# Patient Record
Sex: Female | Born: 1995 | Race: White | Hispanic: No | Marital: Married | Smoking: Never smoker
Health system: Southern US, Community
[De-identification: ages and names within clinical notes are randomized; demographics above are authoritative.]

---

## 2021-10-11 ENCOUNTER — Emergency Department (INDEPENDENT_AMBULATORY_CARE_PROVIDER_SITE_OTHER): Payer: 59

## 2021-10-11 ENCOUNTER — Emergency Department (INDEPENDENT_AMBULATORY_CARE_PROVIDER_SITE_OTHER)
Admission: RE | Admit: 2021-10-11 | Discharge: 2021-10-11 | Disposition: A | Discharge status: Home / Self Care | Payer: 59 | Source: Ambulatory Visit

## 2021-10-11 VITALS — BP 111/72 | HR 78 | Temp 99.5°F | Resp 18 | Ht 65.0 in | Wt 150.0 lb

## 2021-10-11 DIAGNOSIS — N83201 Unspecified ovarian cyst, right side: Secondary | ICD-10-CM

## 2021-10-11 DIAGNOSIS — R102 Pelvic and perineal pain: Secondary | ICD-10-CM

## 2021-10-11 LAB — POCT URINALYSIS DIP (MANUAL ENTRY)
Bilirubin, UA: NEGATIVE
Blood, UA: NEGATIVE
Glucose, UA: NEGATIVE mg/dL
Ketones, POC UA: NEGATIVE mg/dL
Nitrite, UA: NEGATIVE
Protein Ur, POC: NEGATIVE mg/dL
Spec Grav, UA: 1.025 (ref 1.010–1.025)
Urobilinogen, UA: 0.2 E.U./dL
pH, UA: 7 (ref 5.0–8.0)

## 2021-10-11 LAB — POCT URINE PREGNANCY: Preg Test, Ur: NEGATIVE

## 2021-10-11 MED ORDER — CIPROFLOXACIN HCL 250 MG PO TABS: 250.0000 mg | ORAL_TABLET | Freq: Two times a day (BID) | ORAL | 0 refills | Status: AC

## 2021-10-11 MED ORDER — HYDROCODONE-ACETAMINOPHEN 5-325 MG PO TABS: 1.0000 | ORAL_TABLET | Freq: Every day | ORAL | 0 refills | Status: DC | PRN

## 2021-10-11 NOTE — ED Provider Notes (Signed)
?[facility] ? ? ? ?CSN: [id number] ?Arrival date & time: 10/11/21  1015 ? ? ?  ? ?History   ?Chief Complaint ?Chief Complaint  ?Patient presents with  ? Groin Pain  ?  Have missed the last 2 cycles of my period and am now experiencing pain on the right side of my ovary. - Entered by patient  ? ? ?HPI ?Wendy Murillo is a 26 y.o. female.  ? ?HPI Pleasant 26 year old female presents with right ovarian/suprapubic pain for 3 days.  Patient reports 2 missed menstrual cycles.  Patient reports that her GYN cannot evaluate her until July. ? ?History reviewed. No pertinent past medical history. ? ?There are no problems to display for this patient. ? ? ?History reviewed. No pertinent surgical history. ? ?OB History   ?No obstetric history on file. ?  ? ? ? ?Home Medications   ? ?Prior to Admission medications   ?Medication Sig Start Date End Date Taking? Authorizing Provider  ?ciprofloxacin (CIPRO) 250 MG tablet Take 1 tablet (250 mg total) by mouth 2 (two) times daily for 5 days. 10/11/21 10/16/21 Yes Eliezer Lofts, FNP  ?HYDROcodone-acetaminophen (NORCO/VICODIN) 5-325 MG tablet Take 1 tablet by mouth daily as needed. 10/11/21  Yes Eliezer Lofts, FNP  ? ? ?Family History ?Family History  ?Problem Relation Age of Onset  ? ADD / ADHD Mother   ? ? ?Social History ?Social History  ? ?Tobacco Use  ? Smoking status: Never  ? Smokeless tobacco: Never  ?Substance Use Topics  ? Alcohol use: Yes  ?  Comment: occ  ? Drug use: Never  ? ? ? ?Allergies   ?Amoxicillin-pot clavulanate, Cefpodoxime, and Sulfamethoxazole-trimethoprim ? ? ?Review of Systems ?Review of Systems  ?Gastrointestinal:  Positive for abdominal pain.  ?     Right lower/right suprapubic/right ovary pain x2 days  ?All other systems reviewed and are negative. ? ? ?Physical Exam ?Triage Vital Signs ?ED Triage Vitals  ?Enc Vitals Group  ?   BP 10/11/21 1046 111/72  ?   Pulse Rate 10/11/21 1046 78  ?   Resp 10/11/21 1046 18  ?   Temp 10/11/21 1046 99.5 ?F (37.5 ?C)   ?   Temp Source 10/11/21 1046 Oral  ?   SpO2 10/11/21 1046 98 %  ?   Weight 10/11/21 1043 150 lb (68 kg)  ?   Height 10/11/21 1043 5\' 5"  (1.651 m)  ?   Head Circumference --   ?   Peak Flow --   ?   Pain Score 10/11/21 1043 1  ?   Pain Loc --   ?   Pain Edu? --   ?   Excl. in [place]? --   ? ?No data found. ? ?Updated Vital Signs ?BP 111/72 (BP Location: Left Arm)   Pulse 78   Temp 99.5 ?F (37.5 ?C) (Oral)   Resp 18   Ht 5\' 5"  (1.651 m)   Wt 150 lb (68 kg)   LMP 08/08/2021   SpO2 98%   BMI 24.96 kg/m?  ? ?   ? ?Physical Exam ?Vitals and nursing note reviewed.  ?Constitutional:   ?   General: She is not in acute distress. ?   Appearance: Normal appearance. She is normal weight. She is not ill-appearing.  ?HENT:  ?   Head: Normocephalic and atraumatic.  ?   Mouth/Throat:  ?   Mouth: Mucous membranes are moist.  ?   Pharynx: Oropharynx is clear.  ?Eyes:  ?  Extraocular Movements: Extraocular movements intact.  ?   Conjunctiva/sclera: Conjunctivae normal.  ?   Pupils: Pupils are equal, round, and reactive to light.  ?Cardiovascular:  ?   Rate and Rhythm: Normal rate and regular rhythm.  ?   Pulses: Normal pulses.  ?   Heart sounds: Normal heart sounds.  ?Pulmonary:  ?   Effort: Pulmonary effort is normal.  ?   Breath sounds: Normal breath sounds. No wheezing, rhonchi or rales.  ?Abdominal:  ?   Palpations: Abdomen is soft. There is no mass.  ?   Tenderness: There is no right CVA tenderness, left CVA tenderness or rebound.  ?   Hernia: No hernia is present.  ?   Comments: Normoactive bowel sounds x4 quads, severe TTP over right suprapubic area, inferior right lower quadrant, no hepatosplenomegaly  ?Musculoskeletal:  ?   Cervical back: Normal range of motion and neck supple.  ?Skin: ?   General: Skin is warm and dry.  ?Neurological:  ?   General: No focal deficit present.  ?   Mental Status: She is alert and oriented to person, place, and time. Mental status is at baseline.  ? ? ? ?UC Treatments / Results   ?Labs ?(all labs ordered are listed, but only abnormal results are displayed) ?Labs Reviewed  ?POCT URINALYSIS DIP (MANUAL ENTRY) - Abnormal; Notable for the following components:  ?    Result Value  ? Leukocytes, UA Trace (*)   ? All other components within normal limits  ?URINE CULTURE  ?POCT URINE PREGNANCY  ? ? ?EKG ? ? ?Radiology ?US PELVIC COMPLETE WITH TRANSVAGINAL ? ?Result Date: 10/11/2021 ?CLINICAL DATA:  Pelvic pain EXAM: TRANSABDOMINAL AND TRANSVAGINAL ULTRASOUND OF PELVIS TECHNIQUE: Both transabdominal and transvaginal ultrasound examinations of the pelvis were performed. Transabdominal technique was performed for global imaging of the pelvis including uterus, ovaries, adnexal regions, and pelvic cul-de-sac. It was necessary to proceed with endovaginal exam following the transabdominal exam to visualize the uterus and ovaries. COMPARISON:  None Available. FINDINGS: Uterus Measurements: 7.8 x 3.3 x 4.2 cm = volume: 56.2 mL. Anteverted. No fibroids or other mass visualized. Two small myometrial cysts are seen in the lower uterus measuring 2 mm and 3 mm. Endometrium Thickness: 8.2 mm.  No focal abnormality visualized. Right ovary Measurements: 4.8 x 2.1 x 2.9 cm = volume: 14.4 mL. Normal appearance/no adnexal mass. Thick-walled cystic lesion of the right ovary with peripheral vascularity, compatible with corpus luteum cyst. Left ovary Measurements: 3.3 x 1.7 x 1.9 cm = volume: 5.4 mL. Normal appearance/no adnexal mass. Other findings Small volume free fluid in the right adnexa, likely physiologic. IMPRESSION: Corpus luteum cyst of the right ovary with small volume free fluid of the right adnexa, findings are likely physiologic. Electronically Signed   By: Yetta Glassman M.D.   On: 10/11/2021 13:40   ? ?Procedures ?Procedures (including critical care time) ? ?Medications Ordered in UC ?Medications - No data to display ? ?Initial Impression / Assessment and Plan / UC Course  ?I have reviewed the triage  vital signs and the nursing notes. ? ?Pertinent labs & imaging results that were available during my care of the patient were reviewed by me and considered in my medical decision making (see chart for details). ? ?  ? ?MDM: 1.  Suprapubic pain, acute-UA reveals trace leuks, urine culture ordered, will treat empirically with Cipro; 2.  Right ovarian cyst-transvaginal ultrasound revealed above, hard copy provided to patient with this AVS Rx'd Norco. Advised patient  we will treat empirically for trace leuks with Cipro.  Advised patient to take medication as directed with food to completion.  Encouraged patient increase daily water intake while taking this medication.  Advised patient we will follow-up with urine culture results once received.  Advised patient may take Norco daily, as needed for breakthrough pain for right ovarian cyst.  Advised patient follow-up with her GYN for further evaluation of right ovarian cyst.  Patient discharged home, hemodynamically stable. ?Final Clinical Impressions(s) / UC Diagnoses  ? ?Final diagnoses:  ?Suprapubic pain, acute  ?Right ovarian cyst  ? ? ? ?Discharge Instructions   ? ?  ?Advised patient we will treat empirically for trace leuks with Cipro.  Advised patient to take medication as directed with food to completion.  Encouraged patient increase daily water intake while taking this medication.  Advised patient we will follow-up with urine culture results once received.  Advised patient may take Norco daily, as needed for breakthrough pain for right ovarian cyst.  Advised patient follow-up with her GYN for further evaluation of right ovarian cyst. ? ? ? ? ?ED Prescriptions   ? ? Medication Sig Dispense Auth. Provider  ? ciprofloxacin (CIPRO) 250 MG tablet Take 1 tablet (250 mg total) by mouth 2 (two) times daily for 5 days. 10 tablet Eliezer Lofts, FNP  ? HYDROcodone-acetaminophen (NORCO/VICODIN) 5-325 MG tablet Take 1 tablet by mouth daily as needed. 30 tablet Eliezer Lofts,  FNP  ? ?  ? ?I have reviewed the PDMP during this encounter. ?  ?Eliezer Lofts, [place] ?10/11/21 1420 ? ?

## 2021-10-11 NOTE — Discharge Instructions (Addendum)
Advised patient we will treat empirically for trace leuks with Cipro.  Advised patient to take medication as directed with food to completion.  Encouraged patient increase daily water intake while taking this medication.  Advised patient we will follow-up with urine culture results once received.  Advised patient may take Norco daily, as needed for breakthrough pain for right ovarian cyst.  Advised patient follow-up with her GYN for further evaluation of right ovarian cyst. ?

## 2021-10-11 NOTE — ED Triage Notes (Signed)
Right side of abdominal pain. X3days ? ?Pt states that the pain is sharp. Pt states that the pain comes and goes.  ?

## 2021-10-12 LAB — URINE CULTURE
MICRO NUMBER:: 13383150
Result:: NO GROWTH
SPECIMEN QUALITY:: ADEQUATE

## 2022-01-07 ENCOUNTER — Ambulatory Visit (INDEPENDENT_AMBULATORY_CARE_PROVIDER_SITE_OTHER): Payer: 59

## 2022-01-07 ENCOUNTER — Ambulatory Visit
Admission: RE | Admit: 2022-01-07 | Discharge: 2022-01-07 | Disposition: A | Discharge status: Home / Self Care | Payer: 59 | Source: Ambulatory Visit | Attending: Family Medicine | Admitting: Family Medicine

## 2022-01-07 VITALS — BP 123/87 | HR 105 | Temp 99.0°F | Resp 14

## 2022-01-07 DIAGNOSIS — R058 Other specified cough: Secondary | ICD-10-CM | POA: Diagnosis not present

## 2022-01-07 DIAGNOSIS — R093 Abnormal sputum: Secondary | ICD-10-CM | POA: Diagnosis not present

## 2022-01-07 DIAGNOSIS — J309 Allergic rhinitis, unspecified: Secondary | ICD-10-CM | POA: Diagnosis not present

## 2022-01-07 DIAGNOSIS — R059 Cough, unspecified: Secondary | ICD-10-CM

## 2022-01-07 DIAGNOSIS — J0100 Acute maxillary sinusitis, unspecified: Secondary | ICD-10-CM | POA: Diagnosis not present

## 2022-01-07 MED ORDER — DOXYCYCLINE HYCLATE 100 MG PO CAPS: 100.0000 mg | ORAL_CAPSULE | Freq: Two times a day (BID) | ORAL | 0 refills | Status: AC

## 2022-01-07 MED ORDER — BENZONATATE 200 MG PO CAPS: 200.0000 mg | ORAL_CAPSULE | Freq: Three times a day (TID) | ORAL | 0 refills | Status: AC | PRN

## 2022-01-07 MED ORDER — PREDNISONE 20 MG PO TABS: ORAL_TABLET | ORAL | 0 refills | Status: DC

## 2022-01-07 MED ORDER — FEXOFENADINE HCL 180 MG PO TABS: 180.0000 mg | ORAL_TABLET | Freq: Every day | ORAL | 0 refills | Status: DC

## 2022-01-07 NOTE — Discharge Instructions (Addendum)
Advised/informed patient of chest x-ray results with hard copy provided to patient. Instructed patient to take medication as directed with food to completion.  Advised patient to take Prednisone and Allegra with first dose of Doxycycline for the next 5 of 10 days.  Advised may use Allegra as needed afterwards for concurrent postnasal drip/drainage.  Advised may use Tessalon Perles daily or as needed for cough.  Encouraged patient to increase daily water intake while taking these medications.  Advised patient if symptoms worsen and/or unresolved please follow-up with PCP or here for further evaluation.

## 2022-01-07 NOTE — ED Provider Notes (Signed)
[name]    CSN: [id number] Arrival date & time: 01/07/22  1634      History   Chief Complaint Chief Complaint  Patient presents with   Cough    I have had a wet cough for going on 4 weeks now as well as congestion in my nose and ears. I am not getting better and have been taking OTC medication for 3 weeks, and it is not working. - Entered by patient    HPI Wendy Murillo is a 26 y.o. female.   HPI 26 year old female presents with cough for 4 weeks.   History reviewed. No pertinent past medical history.  There are no problems to display for this patient.   History reviewed. No pertinent surgical history.  OB History   No obstetric history on file.      Home Medications    Prior to Admission medications   Medication Sig Start Date End Date Taking? Authorizing Provider  benzonatate (TESSALON) 200 MG capsule Take 1 capsule (200 mg total) by mouth 3 (three) times daily as needed for up to 7 days. 01/07/22 01/14/22 Yes Eliezer Lofts, FNP  doxycycline (VIBRAMYCIN) 100 MG capsule Take 1 capsule (100 mg total) by mouth 2 (two) times daily for 10 days. 01/07/22 01/17/22 Yes Eliezer Lofts, FNP  fexofenadine [facility] ALLERGY) 180 MG tablet Take 1 tablet (180 mg total) by mouth daily for 15 days. 01/07/22 01/22/22 Yes Eliezer Lofts, FNP  predniSONE (DELTASONE) 20 MG tablet Take 3 tabs PO daily x 5 days. 01/07/22  Yes Eliezer Lofts, FNP    Family History Family History  Problem Relation Age of Onset   ADD / ADHD Mother     Social History Social History   Tobacco Use   Smoking status: Never   Smokeless tobacco: Never  Substance Use Topics   Alcohol use: Yes    Comment: occ   Drug use: Never     Allergies   Amoxicillin-pot clavulanate, Cefpodoxime, and Sulfamethoxazole-trimethoprim   Review of Systems Review of Systems  Respiratory:  Positive for cough.   All other systems reviewed and are negative.    Physical Exam Triage Vital Signs ED Triage  Vitals  Enc Vitals Group     BP 01/07/22 1640 123/87     Pulse Rate 01/07/22 1640 (!) 105     Resp 01/07/22 1640 14     Temp 01/07/22 1640 99 F (37.2 C)     Temp Source 01/07/22 1640 Oral     SpO2 01/07/22 1640 96 %     Weight --      Height --      Head Circumference --      Peak Flow --      Pain Score 01/07/22 1641 4     Pain Loc --      Pain Edu? --      Excl. in [place]? --    No data found.  Updated Vital Signs BP 123/87 (BP Location: Right Arm)   Pulse (!) 105   Temp 99 F (37.2 C) (Oral)   Resp 14   SpO2 96%    Physical Exam Vitals and nursing note reviewed.  Constitutional:      General: She is not in acute distress.    Appearance: Normal appearance. She is normal weight. She is not ill-appearing.  HENT:     Head: Normocephalic and atraumatic.     Right Ear: External ear normal.     Left Ear: External ear normal.  Ears:     Comments: Moderate eustachian tube dysfunction noted bilaterally; TM's are clear retracted bilaterally    Nose:     Comments: Turbinates are erythematous/edematous    Mouth/Throat:     Mouth: Mucous membranes are moist.     Pharynx: Oropharynx is clear.     Comments: Moderate amount of clear drainage of posterior oropharynx noted Eyes:     Extraocular Movements: Extraocular movements intact.     Conjunctiva/sclera: Conjunctivae normal.     Pupils: Pupils are equal, round, and reactive to light.  Cardiovascular:     Rate and Rhythm: Normal rate and regular rhythm.     Pulses: Normal pulses.     Heart sounds: Normal heart sounds. No murmur heard. Pulmonary:     Breath sounds: Wheezing present. No rhonchi or rales.     Comments: Subtle inspiratory wheezes noted throughout, frequent nonproductive/productive cough noted Musculoskeletal:     Cervical back: Normal range of motion and neck supple.  Skin:    General: Skin is warm and dry.  Neurological:     General: No focal deficit present.     Mental Status: She is alert and oriented  to person, place, and time.      UC Treatments / Results  Labs (all labs ordered are listed, but only abnormal results are displayed) Labs Reviewed - No data to display  EKG   Radiology DG Chest 2 View  Result Date: 01/07/2022 CLINICAL DATA:  Productive cough with green sputum for 4 weeks. EXAM: CHEST - 2 VIEW COMPARISON:  None Available. FINDINGS: The cardiomediastinal contours are within normal limits. The lungs are clear. No pneumothorax or pleural effusion. No acute finding in the visualized skeleton. IMPRESSION: No evidence of active disease. Electronically Signed   By: Emmaline Kluver M.D.   On: 01/07/2022 17:20    Procedures Procedures (including critical care time)  Medications Ordered in UC Medications - No data to display  Initial Impression / Assessment and Plan / UC Course  I have reviewed the triage vital signs and the nursing notes.  Pertinent labs & imaging results that were available during my care of the patient were reviewed by me and considered in my medical decision making (see chart for details).     MDM: 1.  Cough-CXR revealed above Rx'd Prednisone, Tessalon Perles; 2.  Subacute maxillary sinusitis-Rx'd Doxycycline; 3.  Allergic rhinitis-Rx'd Allegra. Patient to take medication as directed with food to completion.  Advised patient to take Prednisone and Allegra with first dose of Doxycycline for the next 5 of 10 days.  Advised may use Allegra as needed afterwards for concurrent postnasal drip/drainage.  Advised may use Tessalon Perles daily or as needed for cough.  Encouraged patient to increase daily water intake while taking these medications. Advised patient if symptoms worsen and/or unresolved please follow-up with PCP or here for further evaluation.  Work note provided to patient prior to discharge today.  Patient discharged home, hemodynamically stable. Final Clinical Impressions(s) / UC Diagnoses   Final diagnoses:  Cough, unspecified type  Subacute  maxillary sinusitis  Allergic rhinitis, unspecified seasonality, unspecified trigger     Discharge Instructions      Advised/informed patient of chest x-ray results with hard copy provided to patient. Instructed patient to take medication as directed with food to completion.  Advised patient to take Prednisone and Allegra with first dose of Doxycycline for the next 5 of 10 days.  Advised may use Allegra as needed afterwards for concurrent postnasal drip/drainage.  Advised may use Tessalon Perles daily or as needed for cough.  Encouraged patient to increase daily water intake while taking these medications.  Advised patient if symptoms worsen and/or unresolved please follow-up with PCP or here for further evaluation.     ED Prescriptions     Medication Sig Dispense Auth. Provider   doxycycline (VIBRAMYCIN) 100 MG capsule Take 1 capsule (100 mg total) by mouth 2 (two) times daily for 10 days. 20 capsule Trevor Iha, FNP   predniSONE (DELTASONE) 20 MG tablet Take 3 tabs PO daily x 5 days. 15 tablet Trevor Iha, FNP   fexofenadine [facility] ALLERGY) 180 MG tablet Take 1 tablet (180 mg total) by mouth daily for 15 days. 15 tablet Trevor Iha, FNP   benzonatate (TESSALON) 200 MG capsule Take 1 capsule (200 mg total) by mouth 3 (three) times daily as needed for up to 7 days. 40 capsule Trevor Iha, FNP      PDMP not reviewed this encounter.   Trevor Iha, FNP 01/07/22 1746

## 2022-01-07 NOTE — ED Triage Notes (Signed)
Pt presents with cough x 4 weeks.

## 2023-03-24 ENCOUNTER — Ambulatory Visit
Admission: EM | Admit: 2023-03-24 | Discharge: 2023-03-24 | Disposition: A | Discharge status: Home / Self Care | Payer: Commercial Managed Care - PPO | Attending: Family Medicine | Admitting: Family Medicine

## 2023-03-24 DIAGNOSIS — J0100 Acute maxillary sinusitis, unspecified: Secondary | ICD-10-CM | POA: Diagnosis not present

## 2023-03-24 MED ORDER — FLUTICASONE PROPIONATE 50 MCG/ACT NA SUSP: 2.0000 | Freq: Every day | NASAL | 0 refills | Status: AC

## 2023-03-24 MED ORDER — DOXYCYCLINE HYCLATE 100 MG PO CAPS: 100.0000 mg | ORAL_CAPSULE | Freq: Two times a day (BID) | ORAL | 0 refills | Status: AC

## 2023-03-24 NOTE — Discharge Instructions (Signed)
Drink lots of water Take doxycycline 2 times a day.  It is important to take this antibiotic with food Use the Flonase daily until your symptoms have resolved  If you have any sinus and ear congestion on the day that you are scheduled to fly, use Afrin (Oxymetolazone) prior to flight

## 2023-03-24 NOTE — ED Triage Notes (Signed)
Pt c/o nasal congestion, facial/ear pain, bilateral ear pressure x 2 weeks. Denies fever. Taking sudafed, tylenol and ibuprofen prn. Hx of sinus infections.

## 2023-03-24 NOTE — ED Provider Notes (Signed)
[facility]    CSN: [id number] Arrival date & time: 03/24/23  1823      History   Chief Complaint Chief Complaint  Patient presents with   Nasal Congestion    HPI Wendy Murillo is a 27 y.o. female.   Patient states that she has had sinus congestion and cold symptoms for over 2 weeks.  She has ear pressure and pain and some popping.  She is scheduled to fly out of the country in a few days and is concerned about her ear congestion.  She states that she is having thick sinus mucus.  Postnasal drip.  Cough.  Not improving with over-the-counter medications Chart is reviewed and she does have multiple antibiotic allergies including Augmentin, cephalosporins, sulfa    History reviewed. No pertinent past medical history.  There are no problems to display for this patient.   History reviewed. No pertinent surgical history.  OB History   No obstetric history on file.      Home Medications    Prior to Admission medications   Medication Sig Start Date End Date Taking? Authorizing Provider  cabergoline (DOSTINEX) 0.5 MG tablet  07/09/22  Yes [provider]  doxycycline (VIBRAMYCIN) 100 MG capsule Take 1 capsule (100 mg total) by mouth 2 (two) times daily. 03/24/23  Yes Eustace Moore, MD  fluticasone [facility]) 50 MCG/ACT nasal spray Place 2 sprays into both nostrils daily. 03/24/23  Yes Eustace Moore, MD    Family History Family History  Problem Relation Age of Onset   ADD / ADHD Mother     Social History Social History   Tobacco Use   Smoking status: Never   Smokeless tobacco: Never  Substance Use Topics   Alcohol use: Yes    Comment: occ   Drug use: Never     Allergies   Amoxicillin-pot clavulanate, Cefpodoxime, and Sulfamethoxazole-trimethoprim   Review of Systems Review of Systems See HPI  Physical Exam Triage Vital Signs ED Triage Vitals  Encounter Vitals Group     BP 03/24/23 1833 120/77     Systolic BP Percentile  --      Diastolic BP Percentile --      Pulse Rate 03/24/23 1833 (!) 103     Resp 03/24/23 1833 17     Temp 03/24/23 1833 98.4 F (36.9 C)     Temp Source 03/24/23 1833 Oral     SpO2 03/24/23 1833 97 %     Weight --      Height --      Head Circumference --      Peak Flow --      Pain Score 03/24/23 1835 4     Pain Loc --      Pain Education --      Exclude from Growth Chart --    No data found.  Updated Vital Signs BP 120/77 (BP Location: Right Arm)   Pulse (!) 103   Temp 98.4 F (36.9 C) (Oral)   Resp 17   SpO2 97%     Physical Exam Constitutional:      General: She is not in acute distress.    Appearance: She is well-developed and normal weight.  HENT:     Head: Normocephalic and atraumatic.     Right Ear: Ear canal normal. There is no impacted cerumen.     Left Ear: Ear canal normal. There is no impacted cerumen.     Ears:     Comments: Injection of  TMs.  Dull.    Nose: Congestion and rhinorrhea present.     Mouth/Throat:     Pharynx: Posterior oropharyngeal erythema present.  Eyes:     Conjunctiva/sclera: Conjunctivae normal.     Pupils: Pupils are equal, round, and reactive to light.  Cardiovascular:     Rate and Rhythm: Normal rate and regular rhythm.     Heart sounds: Normal heart sounds.  Pulmonary:     Effort: Pulmonary effort is normal. No respiratory distress.     Breath sounds: Normal breath sounds.  Abdominal:     General: There is no distension.     Palpations: Abdomen is soft.  Musculoskeletal:        General: Normal range of motion.     Cervical back: Normal range of motion.  Lymphadenopathy:     Cervical: Cervical adenopathy present.  Skin:    General: Skin is warm and dry.  Neurological:     Mental Status: She is alert.      UC Treatments / Results  Labs (all labs ordered are listed, but only abnormal results are displayed) Labs Reviewed - No data to display  EKG   Radiology No results found.  Procedures Procedures  (including critical care time)  Medications Ordered in UC Medications - No data to display  Initial Impression / Assessment and Plan / UC Course  I have reviewed the triage vital signs and the nursing notes.  Pertinent labs & imaging results that were available during my care of the patient were reviewed by me and considered in my medical decision making (see chart for details).     Final Clinical Impressions(s) / UC Diagnoses   Final diagnoses:  Acute non-recurrent maxillary sinusitis     Discharge Instructions      Drink lots of water Take doxycycline 2 times a day.  It is important to take this antibiotic with food Use the Flonase daily until your symptoms have resolved  If you have any sinus and ear congestion on the day that you are scheduled to fly, use Afrin (Oxymetolazone) prior to flight   ED Prescriptions     Medication Sig Dispense Auth. Provider   doxycycline (VIBRAMYCIN) 100 MG capsule Take 1 capsule (100 mg total) by mouth 2 (two) times daily. 14 capsule Eustace Moore, MD   fluticasone [facility]) 50 MCG/ACT nasal spray Place 2 sprays into both nostrils daily. 16 g Eustace Moore, MD      PDMP not reviewed this encounter.   Eustace Moore, MD 03/24/23 [phone]

## 2024-02-27 IMAGING — US US PELVIS COMPLETE WITH TRANSVAGINAL
1 series · 13 of 25 positions shown · non-contrast
Comparison: None Available.

CLINICAL DATA: Pelvic pain

EXAM:
TRANSABDOMINAL AND TRANSVAGINAL ULTRASOUND OF PELVIS
TECHNIQUE: Both transabdominal and transvaginal ultrasound examinations of the
pelvis were performed. Transabdominal technique was performed for
global imaging of the pelvis including uterus, ovaries, adnexal
regions, and pelvic cul-de-sac. It was necessary to proceed with
endovaginal exam following the transabdominal exam to visualize the
uterus and ovaries.

[Series 1: us pelvic complete with transvaginal · 13 of 118 slices shown]
[im 1/118]
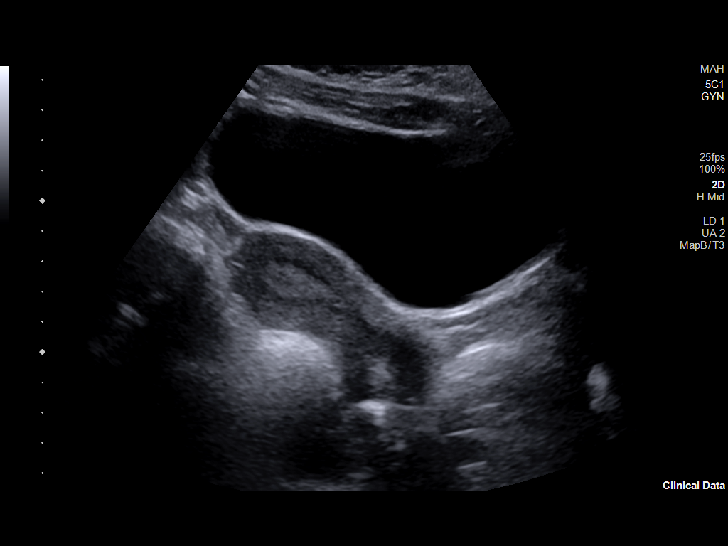
[im 10/118]
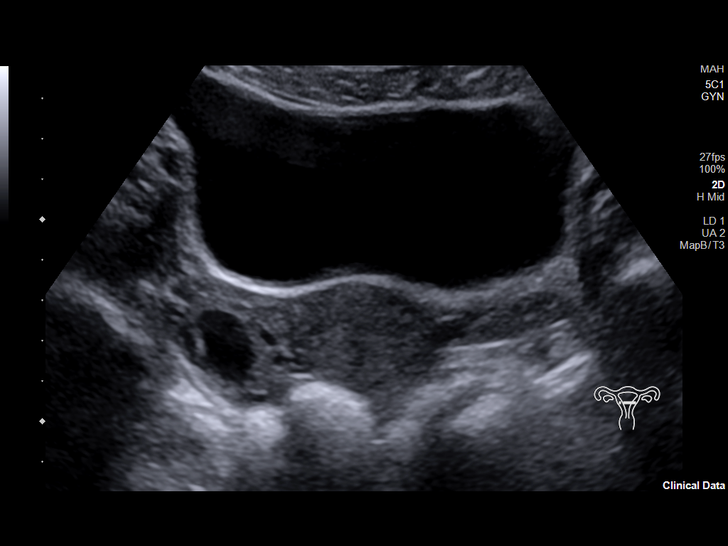
[im 20/118]
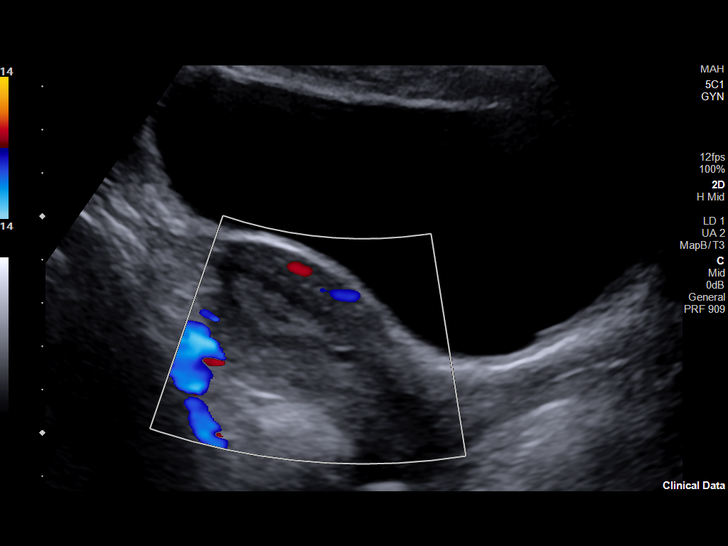
[im 30/118]
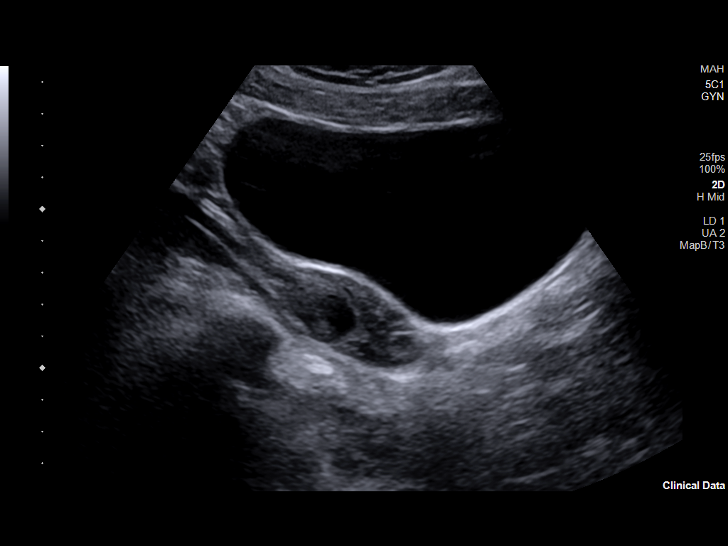
[im 40/118]
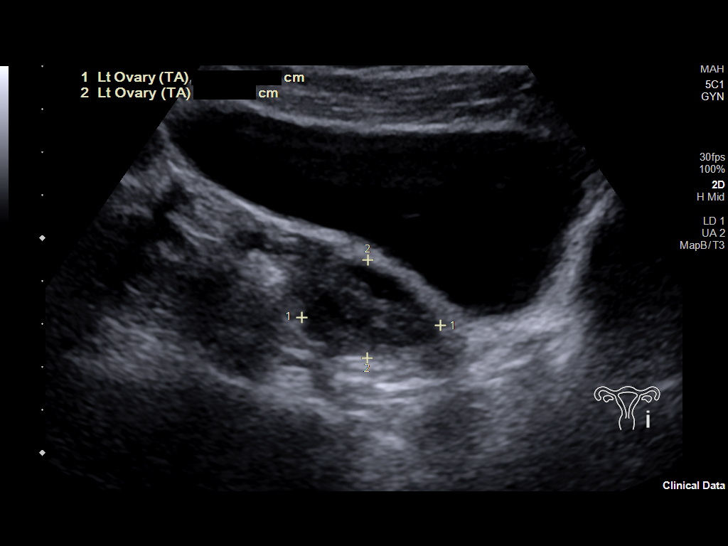
[im 49/118]
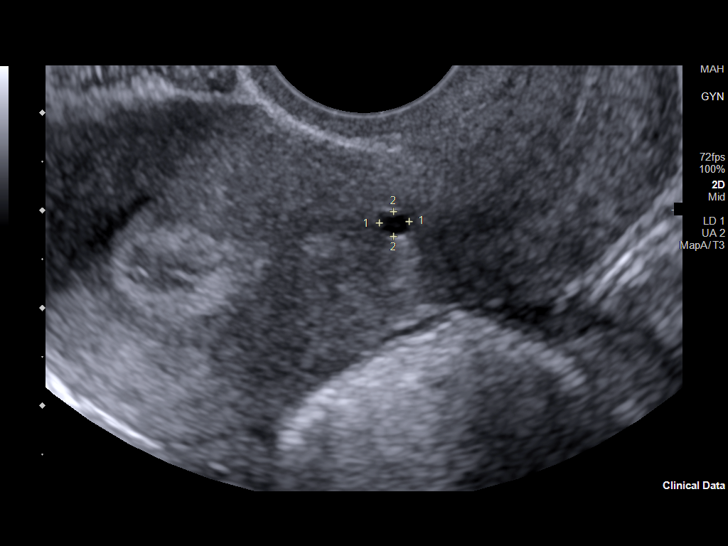
[im 59/118]
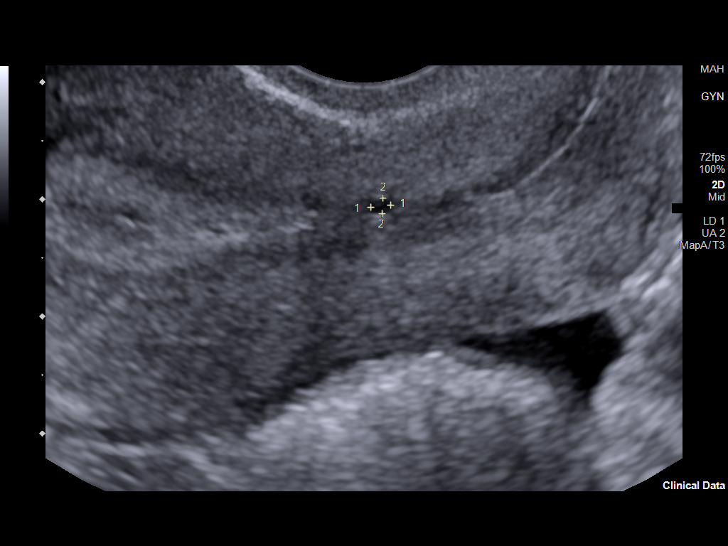
[im 69/118]
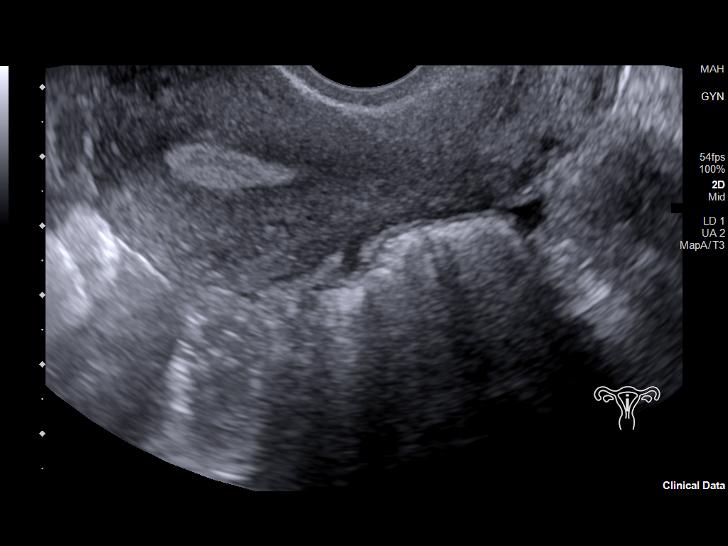
[im 79/118]
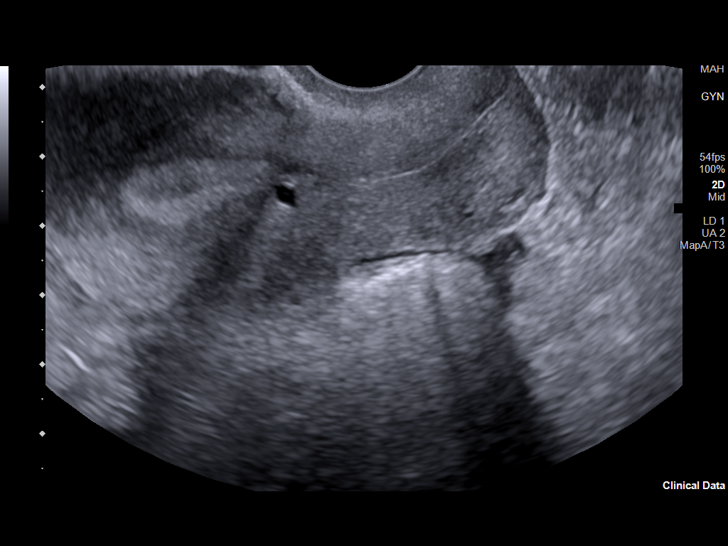
[im 88/118]
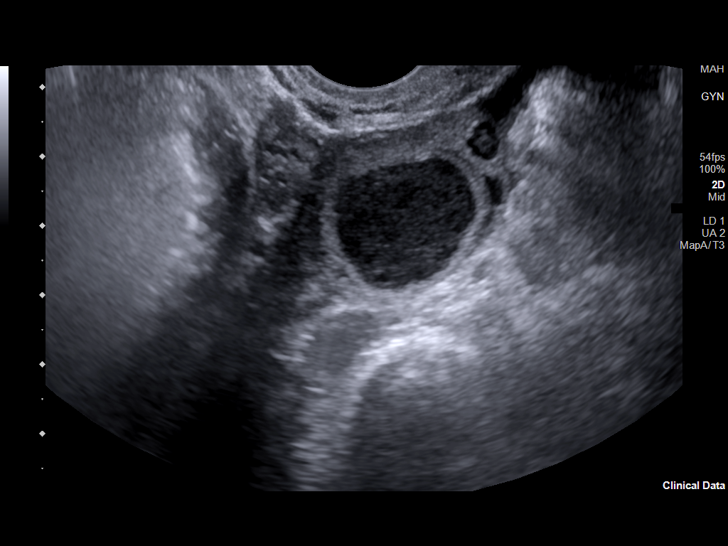
[im 98/118]
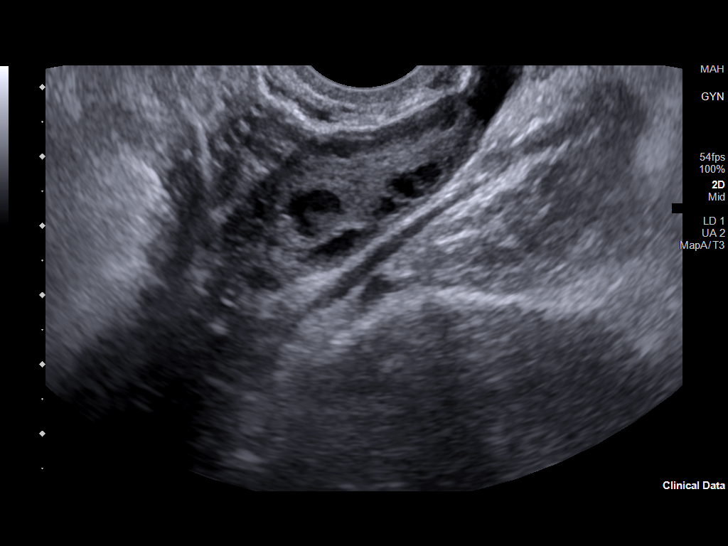
[im 108/118]
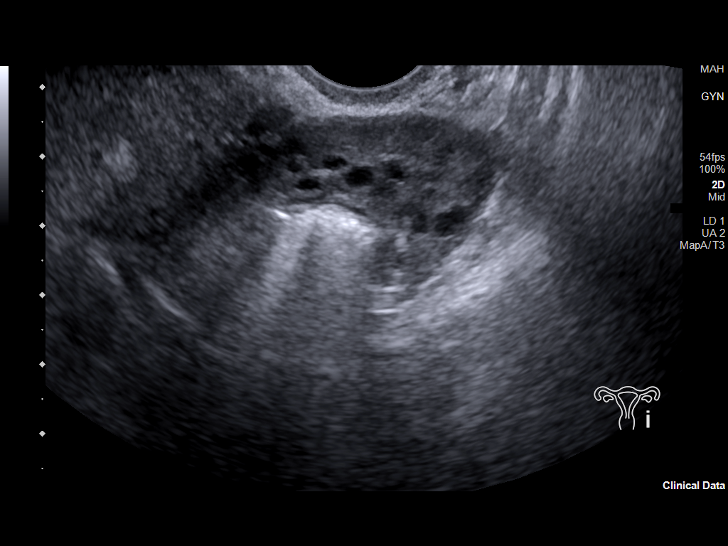
[im 118/118]
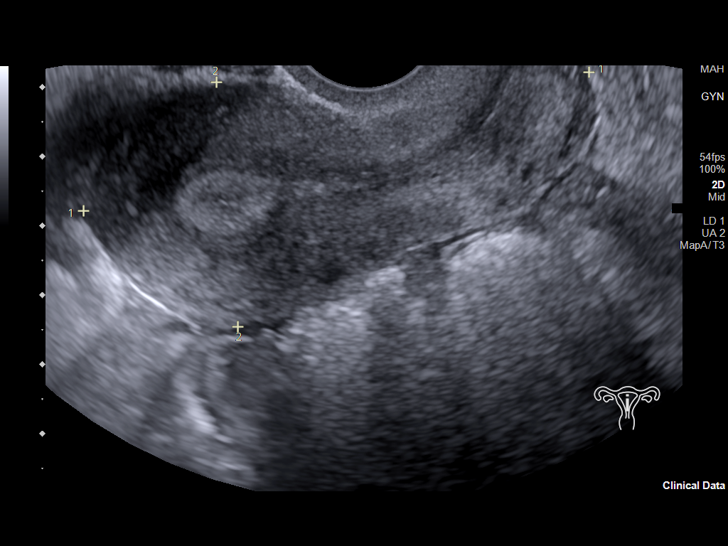

[13 of 25 positions shown; findings below may reference images not displayed]

FINDINGS: Uterus

Measurements: 7.8 x 3.3 x 4.2 cm = volume: 56.2 mL. Anteverted. No
fibroids or other mass visualized. Two small myometrial cysts are
seen in the lower uterus measuring 2 mm and 3 mm.

Endometrium

Thickness: 8.2 mm.  No focal abnormality visualized.

Right ovary

Measurements: 4.8 x 2.1 x 2.9 cm = volume: 14.4 mL. Normal
appearance/no adnexal mass. Thick-walled cystic lesion of the right
ovary with peripheral vascularity, compatible with corpus luteum
cyst.

Left ovary

Measurements: 3.3 x 1.7 x 1.9 cm = volume: 5.4 mL. Normal
appearance/no adnexal mass.

Other findings

Small volume free fluid in the right adnexa, likely physiologic.
IMPRESSION: Corpus luteum cyst of the right ovary with small volume free fluid
of the right adnexa, findings are likely physiologic.
